# Patient Record
Sex: Male | Born: 2000 | Race: White | Hispanic: No | Marital: Single
Health system: Southern US, Community
[De-identification: ages and names within clinical notes are randomized; demographics above are authoritative.]

---
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
## 2021-04-13 ENCOUNTER — Emergency Department (HOSPITAL_COMMUNITY): Payer: Managed Care, Other (non HMO)

## 2021-04-13 ENCOUNTER — Emergency Department (HOSPITAL_COMMUNITY)
Admission: EM | Admit: 2021-04-13 | Discharge: 2021-04-14 | Disposition: A | Discharge status: Home / Self Care | Payer: Managed Care, Other (non HMO) | Attending: Emergency Medicine | Admitting: Emergency Medicine

## 2021-04-13 ENCOUNTER — Encounter (HOSPITAL_COMMUNITY): Payer: Self-pay

## 2021-04-13 DIAGNOSIS — M549 Dorsalgia, unspecified: Secondary | ICD-10-CM | POA: Insufficient documentation

## 2021-04-13 DIAGNOSIS — E860 Dehydration: Secondary | ICD-10-CM

## 2021-04-13 DIAGNOSIS — R7401 Elevation of levels of liver transaminase levels: Secondary | ICD-10-CM | POA: Diagnosis not present

## 2021-04-13 DIAGNOSIS — J029 Acute pharyngitis, unspecified: Secondary | ICD-10-CM | POA: Diagnosis not present

## 2021-04-13 DIAGNOSIS — R251 Tremor, unspecified: Secondary | ICD-10-CM | POA: Diagnosis not present

## 2021-04-13 DIAGNOSIS — R112 Nausea with vomiting, unspecified: Secondary | ICD-10-CM | POA: Insufficient documentation

## 2021-04-13 LAB — COMPREHENSIVE METABOLIC PANEL
ALT: 197 U/L — ABNORMAL HIGH (ref 0–44)
AST: 170 U/L — ABNORMAL HIGH (ref 15–41)
Albumin: 5.3 g/dL — ABNORMAL HIGH (ref 3.5–5.0)
Alkaline Phosphatase: 59 U/L (ref 38–126)
Anion gap: 19 — ABNORMAL HIGH (ref 5–15)
BUN: 37 mg/dL — ABNORMAL HIGH (ref 6–20)
CO2: 19 mmol/L — ABNORMAL LOW (ref 22–32)
Calcium: 10.6 mg/dL — ABNORMAL HIGH (ref 8.9–10.3)
Chloride: 99 mmol/L (ref 98–111)
Creatinine, Ser: 1.52 mg/dL — ABNORMAL HIGH (ref 0.61–1.24)
GFR, Estimated: >60 mL/min (ref >60)
Glucose, Bld: 117 mg/dL — ABNORMAL HIGH (ref 70–99)
Potassium: 6.2 mmol/L — ABNORMAL HIGH (ref 3.5–5.1)
Sodium: 137 mmol/L (ref 135–145)
Total Bilirubin: 1.3 mg/dL — ABNORMAL HIGH (ref 0.3–1.2)
Total Protein: 9.2 g/dL — ABNORMAL HIGH (ref 6.5–8.1)

## 2021-04-13 LAB — LIPASE, BLOOD: Lipase: 29 U/L (ref 11–51)

## 2021-04-13 LAB — ETHANOL: Alcohol, Ethyl (B): <10 mg/dL (ref <10)

## 2021-04-13 MED ORDER — LORAZEPAM 2 MG/ML IJ SOLN
0.5000 mg | Freq: Once | INTRAMUSCULAR | Status: AC
  Administered 2021-04-13: 0.5 mg via INTRAVENOUS
  Filled 2021-04-13: qty 1

## 2021-04-13 MED ORDER — PANTOPRAZOLE SODIUM 40 MG IV SOLR
40.0000 mg | Freq: Once | INTRAVENOUS | Status: AC
  Administered 2021-04-13: 40 mg via INTRAVENOUS
  Filled 2021-04-13: qty 40

## 2021-04-13 MED ORDER — LACTATED RINGERS IV BOLUS
2000.0000 mL | Freq: Once | INTRAVENOUS | Status: AC
  Administered 2021-04-13: 2000 mL via INTRAVENOUS

## 2021-04-13 MED ORDER — METOCLOPRAMIDE HCL 5 MG/ML IJ SOLN
10.0000 mg | INTRAMUSCULAR | Status: AC
  Administered 2021-04-13: 10 mg via INTRAVENOUS
  Filled 2021-04-13: qty 2

## 2021-04-13 MED ORDER — FAMOTIDINE IN NACL 20-0.9 MG/50ML-% IV SOLN
20.0000 mg | Freq: Once | INTRAVENOUS | Status: AC
  Administered 2021-04-13: 20 mg via INTRAVENOUS
  Filled 2021-04-13: qty 50

## 2021-04-13 NOTE — ED Triage Notes (Signed)
Pt came in with c/o n/v and sore throat after drinking a pint of Jim Beam yesterday. Pt states that he cannot hold anything down and his throat feels swollen from throwing up and dry heaving. Throat does not appear swollen.

## 2021-04-13 NOTE — ED Provider Notes (Signed)
[facility]-EMERGENCY DEPT Provider Note   CSN: [id number] Arrival date & time: 04/13/21  2036     History Chief Complaint  Patient presents with  . Emesis  . Sore Throat    Michael Bean is a 20 y.o. male.  20 year old male with no significant past medical history presents to the emergency department for evaluation of nausea and vomiting.  Symptoms have been persistent x29 hours.  Has not been able to tolerate any food or fluids.  His symptoms began after a day of drinking with his friends.  Reports having a pint of Jim Beam as well as some beers.  He is complaining of sore throat; feels as though his throat is swollen. Notes some intermittent discomfort in his chest and back. Mother states that patient has had episodes of uncontrolled shaking, but no seizure activity.  No known fevers.  He has been passing flatus.  No history of prior abdominal surgeries.  The history is provided by the patient and a parent. No language interpreter was used.  Emesis Sore Throat       History reviewed. No pertinent past medical history.  There are no problems to display for this patient.   History reviewed. No pertinent surgical history.     History reviewed. No pertinent family history.     Home Medications Prior to Admission medications   Medication Sig Start Date End Date Taking? Authorizing Provider  ondansetron (ZOFRAN ODT) 4 MG disintegrating tablet Take 1 tablet (4 mg total) by mouth every 8 (eight) hours as needed for nausea or vomiting. 04/14/21  Yes Antony Madura, PA-C    Allergies    Patient has no known allergies.  Review of Systems   Review of Systems  Gastrointestinal: Positive for vomiting.  Ten systems reviewed and are negative for acute change, except as noted in the HPI.    Physical Exam Updated Vital Signs BP 138/85   Pulse 95   Temp 98.3 F (36.8 C) (Oral)   Resp 13   Ht 6' (1.829 m)   Wt 81.6 kg   SpO2 96%   BMI 24.41  kg/m   Physical Exam Vitals and nursing note reviewed.  Constitutional:      Appearance: He is ill-appearing and diaphoretic.     Comments: Diaphoretic, rigorous.  HENT:     Head: Normocephalic and atraumatic.     Right Ear: External ear normal.     Left Ear: External ear normal.     Mouth/Throat:     Mouth: Mucous membranes are dry.     Comments: Oropharynx clear. Tolerating secretions. Eyes:     Conjunctiva/sclera: Conjunctivae normal.  Neck:     Comments: No stridor. Cardiovascular:     Rate and Rhythm: Normal rate and regular rhythm.     Pulses: Normal pulses.  Pulmonary:     Effort: Pulmonary effort is normal. No respiratory distress.     Breath sounds: No stridor. No wheezing or rhonchi.  Abdominal:     Palpations: Abdomen is soft. There is no mass.     Tenderness: There is no guarding.     Comments: Mild epigastric TTP. No abdominal distension. No peritoneal signs.  Musculoskeletal:        General: Normal range of motion.  Neurological:     Mental Status: He is alert.     Coordination: Coordination normal.     ED Results / Procedures / Treatments   Labs (all labs ordered are listed, but only abnormal  results are displayed) Labs Reviewed  CBC WITH DIFFERENTIAL/PLATELET - Abnormal; Notable for the following components:      Result Value   WBC 14.9 (*)    RBC 6.07 (*)    Hemoglobin 17.5 (*)    Neutro Abs 11.0 (*)    Monocytes Absolute 1.7 (*)    All other components within normal limits  COMPREHENSIVE METABOLIC PANEL - Abnormal; Notable for the following components:   Potassium 6.2 (*)    CO2 19 (*)    Glucose, Bld 117 (*)    BUN 37 (*)    Creatinine, Ser 1.52 (*)    Calcium 10.6 (*)    Total Protein 9.2 (*)    Albumin 5.3 (*)    AST 170 (*)    ALT 197 (*)    Total Bilirubin 1.3 (*)    Anion gap 19 (*)    All other components within normal limits  CK - Abnormal; Notable for the following components:   Total CK 1,162 (*)    All other components  within normal limits  BLOOD GAS, VENOUS - Abnormal; Notable for the following components:   pCO2, Ven 42.8 (*)    pO2, Ven 87.0 (*)    Acid-Base Excess 2.8 (*)    All other components within normal limits  COMPREHENSIVE METABOLIC PANEL - Abnormal; Notable for the following components:   Glucose, Bld 125 (*)    BUN 34 (*)    AST 107 (*)    ALT 144 (*)    Total Bilirubin 1.5 (*)    All other components within normal limits  LIPASE, BLOOD  ETHANOL  MAGNESIUM  POTASSIUM    EKG EKG Interpretation  Date/Time:  Saturday April 13 2021 22:35:41 EDT Ventricular Rate:  100 PR Interval:  129 QRS Duration: 102 QT Interval:  378 QTC Calculation: 488 R Axis:   84 Text Interpretation: Sinus tachycardia Probable left ventricular hypertrophy Borderline prolonged QT interval Confirmed by Geoffery Lyons ([id number]) on 04/14/2021 12:51:18 AM   Radiology DG Chest Port 1 View  Result Date: 04/13/2021 CLINICAL DATA:  Chest pain, nausea, and vomiting.  Sore throat. EXAM: PORTABLE CHEST 1 VIEW COMPARISON:  None. FINDINGS: The heart size and mediastinal contours are within normal limits. Both lungs are clear. The visualized skeletal structures are unremarkable. IMPRESSION: No active disease. Electronically Signed   By: Burman Nieves M.D.   On: 04/13/2021 23:05    Procedures Procedures   Medications Ordered in ED Medications  lactated ringers bolus 2,000 mL (0 mLs Intravenous Stopped 04/14/21 0027)  metoCLOPramide (REGLAN) injection 10 mg (10 mg Intravenous Given 04/13/21 2254)  famotidine (PEPCID) IVPB 20 mg premix (0 mg Intravenous Stopped 04/14/21 0007)  pantoprazole (PROTONIX) injection 40 mg (40 mg Intravenous Given 04/13/21 2255)  LORazepam (ATIVAN) injection 0.5 mg (0.5 mg Intravenous Given 04/13/21 2254)  lactated ringers bolus 500 mL (0 mLs Intravenous Stopped 04/14/21 0243)    ED Course  I have reviewed the triage vital signs and the nursing notes.  Pertinent labs & imaging results that  were available during my care of the patient were reviewed by me and considered in my medical decision making (see chart for details).  Clinical Course as of 04/14/21 0320  Sat Apr 13, 2021  2243 Borderline QTc prolongation noted on EKG. Will repeat EKG following IVF hydration. [KH]  2342 Patient reassessed.  He is sleeping.  Appears much more calm.  Denies nausea at this time.  Laboratory evaluation pending. [KH]  2343 Chest  x-ray negative for acute cardiopulmonary abnormality.  Specifically, no air under the diaphragm on my visualization of images. [KH]  2359 Will repeat potassium.  Initial value 6.2.  No peaked T waves on EKG. [KH]  Sun Apr 14, 2021  0000 No old lab values for comparison, but noted to have signs of acute kidney injury favored secondary to dehydration.  Transaminitis may be related to recent heavy alcohol consumption. He has a metabolic acidosis; suspect alcoholic ketoacidosis. Lipase normal. No present concern for pancreatitis.  [KH]  0034 Repeat EKG ordered [KH]  0034 CBC with leukocytosis, likely stress related.  May be mildly elevated from hemoconcentration.  VBG reordered as specimen clotted. [KH]  0129 Repeat potassium normal. Patient feeling better. No vomiting. Will trial ice chips.  [KH]  2536 Electrolytes have normalized.  Creatinine improved. Vital stable. [KH]  0315 Tolerating water and ice chips.  Expresses comfort with discharge. [KH]    Clinical Course User Index [KH] Antony Madura, PA-C   MDM Rules/Calculators/A&P                          20 year old male presents for persistent nausea and vomiting.  He has had too numerous to count episodes over the past 24+ hours.  Symptoms began after heavy day of binge drinking with friends.  Noted to be intermittently tachycardic on arrival with nausea, dry heaves, rigors.  Symptoms have significantly improved with IV fluid hydration, antiemetics, PPI.  Initially with metabolic acidosis, acute kidney injury.  These have  normalized with rehydration.  Patient now tolerating ice chips.  Reports feeling much better.  He is appropriate for discharge and outpatient follow-up with his primary care doctor.  Have recommended that he be seen in 1 week for repeat LFT testing to ensure that these are returning back to baseline.  Return precautions discussed and provided. Patient discharged in stable condition with no unaddressed concerns.   Final Clinical Impression(s) / ED Diagnoses Final diagnoses:  Non-intractable vomiting with nausea, unspecified vomiting type  Acute dehydration  Transaminitis    Rx / DC Orders ED Discharge Orders         Ordered    ondansetron (ZOFRAN ODT) 4 MG disintegrating tablet  Every 8 hours PRN        04/14/21 0317           Antony Madura, PA-C 04/14/21 0320    Geoffery Lyons, MD 04/14/21 [phone]

## 2021-04-14 LAB — CBC WITH DIFFERENTIAL/PLATELET
Abs Immature Granulocytes: 0.06 10*3/uL (ref 0.00–0.07)
Basophils Absolute: 0 10*3/uL (ref 0.0–0.1)
Basophils Relative: 0 %
Eosinophils Absolute: 0 10*3/uL (ref 0.0–0.5)
Eosinophils Relative: 0 %
HCT: 50.3 % (ref 39.0–52.0)
Hemoglobin: 17.5 g/dL — ABNORMAL HIGH (ref 13.0–17.0)
Immature Granulocytes: 0 %
Lymphocytes Relative: 15 %
Lymphs Abs: 2.2 10*3/uL (ref 0.7–4.0)
MCH: 28.8 pg (ref 26.0–34.0)
MCHC: 34.8 g/dL (ref 30.0–36.0)
MCV: 82.9 fL (ref 80.0–100.0)
Monocytes Absolute: 1.7 10*3/uL — ABNORMAL HIGH (ref 0.1–1.0)
Monocytes Relative: 11 %
Neutro Abs: 11 10*3/uL — ABNORMAL HIGH (ref 1.7–7.7)
Neutrophils Relative %: 74 %
Platelets: 279 10*3/uL (ref 150–400)
RBC: 6.07 MIL/uL — ABNORMAL HIGH (ref 4.22–5.81)
RDW: 12.4 % (ref 11.5–15.5)
WBC: 14.9 10*3/uL — ABNORMAL HIGH (ref 4.0–10.5)
nRBC: 0 % (ref 0.0–0.2)

## 2021-04-14 LAB — BLOOD GAS, VENOUS
Acid-Base Excess: 2.8 mmol/L — ABNORMAL HIGH (ref 0.0–2.0)
Bicarbonate: 27.2 mmol/L (ref 20.0–28.0)
O2 Saturation: 96.6 %
Patient temperature: 98.6
pCO2, Ven: 42.8 mmHg — ABNORMAL LOW (ref 44.0–60.0)
pH, Ven: 7.419 (ref 7.250–7.430)
pO2, Ven: 87 mmHg — ABNORMAL HIGH (ref 32.0–45.0)

## 2021-04-14 LAB — COMPREHENSIVE METABOLIC PANEL
ALT: 144 U/L — ABNORMAL HIGH (ref 0–44)
AST: 107 U/L — ABNORMAL HIGH (ref 15–41)
Albumin: 4.4 g/dL (ref 3.5–5.0)
Alkaline Phosphatase: 51 U/L (ref 38–126)
Anion gap: 10 (ref 5–15)
BUN: 34 mg/dL — ABNORMAL HIGH (ref 6–20)
CO2: 27 mmol/L (ref 22–32)
Calcium: 9.5 mg/dL (ref 8.9–10.3)
Chloride: 101 mmol/L (ref 98–111)
Creatinine, Ser: 1.1 mg/dL (ref 0.61–1.24)
GFR, Estimated: >60 mL/min (ref >60)
Glucose, Bld: 125 mg/dL — ABNORMAL HIGH (ref 70–99)
Potassium: 3.8 mmol/L (ref 3.5–5.1)
Sodium: 138 mmol/L (ref 135–145)
Total Bilirubin: 1.5 mg/dL — ABNORMAL HIGH (ref 0.3–1.2)
Total Protein: 7.4 g/dL (ref 6.5–8.1)

## 2021-04-14 LAB — POTASSIUM: Potassium: 3.6 mmol/L (ref 3.5–5.1)

## 2021-04-14 LAB — CK: Total CK: 1162 U/L — ABNORMAL HIGH (ref 49–397)

## 2021-04-14 LAB — MAGNESIUM: Magnesium: 2.2 mg/dL (ref 1.7–2.4)

## 2021-04-14 MED ORDER — LACTATED RINGERS IV BOLUS
500.0000 mL | Freq: Once | INTRAVENOUS | Status: AC
  Administered 2021-04-14: 500 mL via INTRAVENOUS

## 2021-04-14 MED ORDER — ONDANSETRON 4 MG PO TBDP: 4.0000 mg | ORAL_TABLET | Freq: Three times a day (TID) | ORAL | 0 refills | Status: AC | PRN

## 2021-04-14 NOTE — Discharge Instructions (Signed)
We recommend over-the-counter Pepcid, Prilosec, or Nexium over the next few days to week to help prevent symptom recurrence.  You have been prescribed a short course of Zofran to use for nausea management.  Continue a bland diet over the next 1 to 2 days and slowly advance your diet back to normal.  Your liver tests were slightly elevated today.  This is suspected to be related to your binge drinking and persistent vomiting for over 24 hours.  These tests should be rechecked by your primary care doctor in 1 week.  Return to the ED for new or concerning symptoms.

## 2021-04-14 NOTE — ED Notes (Signed)
Pt able to tolerate PO intake at this time °

## 2022-12-12 IMAGING — DX DG CHEST 1V PORT
1 series · 1 of 1 positions shown · non-contrast
Comparison: None.

CLINICAL DATA: Chest pain, nausea, and vomiting.  Sore throat.

EXAM:
PORTABLE CHEST 1 VIEW

[chest ap]
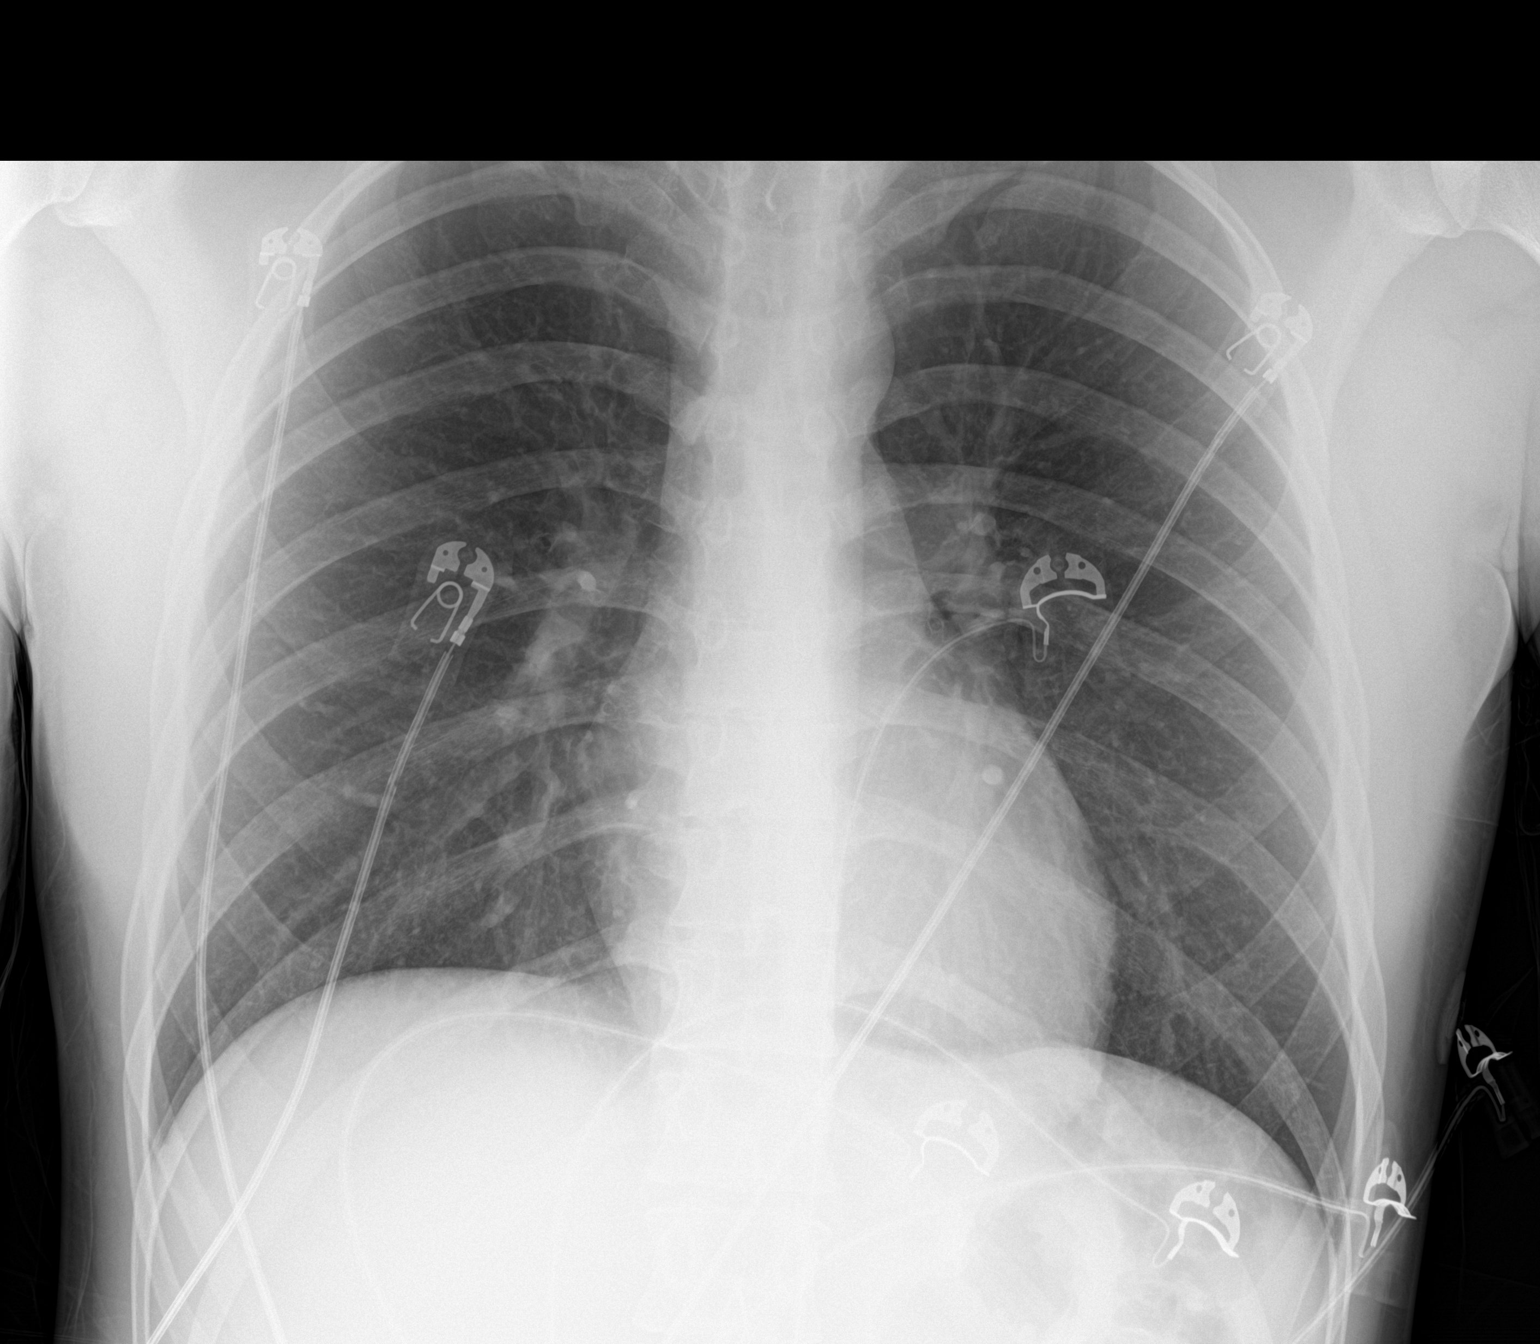

[1 of 1 positions shown; findings below may reference images not displayed]

FINDINGS: The heart size and mediastinal contours are within normal limits.
Both lungs are clear. The visualized skeletal structures are
unremarkable.
IMPRESSION: No active disease.
